# Patient Record
Sex: Female | Born: 1960 | Race: White | Hispanic: No | Marital: Married | Smoking: Never smoker
Health system: Southern US, Community
[De-identification: ages and names within clinical notes are randomized; demographics above are authoritative.]

## PROBLEM LIST (undated history)

## (undated) DIAGNOSIS — M1990 Unspecified osteoarthritis, unspecified site: Secondary | ICD-10-CM

## (undated) HISTORY — DX: Unspecified osteoarthritis, unspecified site: M19.90

---

## 2006-11-17 HISTORY — PX: ABDOMINAL HYSTERECTOMY: SHX81

## 2010-09-16 ENCOUNTER — Ambulatory Visit: Payer: Self-pay | Admitting: Family Medicine

## 2011-09-23 ENCOUNTER — Ambulatory Visit: Payer: Self-pay | Admitting: Family Medicine

## 2012-09-23 ENCOUNTER — Ambulatory Visit: Payer: Self-pay | Admitting: Family Medicine

## 2013-09-26 ENCOUNTER — Ambulatory Visit: Payer: Self-pay | Admitting: Family Medicine

## 2014-09-27 ENCOUNTER — Ambulatory Visit: Payer: Self-pay | Admitting: Family Medicine

## 2015-01-19 ENCOUNTER — Encounter: Payer: Self-pay | Admitting: Podiatry

## 2015-01-19 ENCOUNTER — Ambulatory Visit (INDEPENDENT_AMBULATORY_CARE_PROVIDER_SITE_OTHER): Payer: BLUE CROSS/BLUE SHIELD | Admitting: Podiatry

## 2015-01-19 VITALS — BP 123/73 | HR 68 | Resp 16 | Ht 66.0 in | Wt 255.0 lb

## 2015-01-19 DIAGNOSIS — M779 Enthesopathy, unspecified: Secondary | ICD-10-CM | POA: Diagnosis not present

## 2015-01-19 DIAGNOSIS — M205X9 Other deformities of toe(s) (acquired), unspecified foot: Secondary | ICD-10-CM

## 2015-01-20 NOTE — Progress Notes (Signed)
Subjective:     Patient ID: Jenna Kennedy, female   DOB: August 28, 1961, 54 y.o.   MRN: [id number]  HPI patient has had chronic hallux limitus condition that's been treated with orthotics which is given her reasonable relief with continued discomfort in the joints but nowhere near to the same degree   Review of Systems     Objective:   Physical Exam Neurovascular status intact with continued discomfort in the first MPJ of both feet that has improved quite a bit but still is sore specially without orthotics    Assessment:     Hallux limitus deformity with slight progression left over right with inflammatory capsulitis that is under control currently    Plan:     Scan for new orthotics and reviewed continued rigid bottom shoes and other Hager modification. Reappoint when orthotics returned

## 2015-02-22 ENCOUNTER — Encounter: Payer: Self-pay | Admitting: Podiatry

## 2015-03-22 ENCOUNTER — Ambulatory Visit (INDEPENDENT_AMBULATORY_CARE_PROVIDER_SITE_OTHER): Payer: BLUE CROSS/BLUE SHIELD | Admitting: Podiatry

## 2015-03-22 VITALS — BP 140/86 | HR 65 | Resp 16

## 2015-03-22 DIAGNOSIS — M205X9 Other deformities of toe(s) (acquired), unspecified foot: Secondary | ICD-10-CM | POA: Diagnosis not present

## 2015-03-22 NOTE — Progress Notes (Signed)
Patient ID: Jenna Kennedy, female   DOB: [birth year], 54 y.o.   MRN: [id number]  Subjective:  54 year old female presents the office today with complaints of her orthotics not fitting well and she is unable to wear them for more than 2 hours due to pain. She states  That she continues with her old orthotics as they are more comfortable.  She states she hasn't dry skin and she had a small crack and her skin on the right foot. She denies any drainage or any redness or any clinical signs of infection. No other complaints at this time.  Objective: AAO x3, NAD Neurovascular status unchanged.   there is a decrease and first MTPJ dorsiflexion warm the left than the right. There is pain with first MTPJ range of motion. There is a decrease in medial arch height upon weightbearing. Equinus is present. There is no specific area pinpoint bony tenderness or pain the vibratory sensation to bilateral lower extremes. No overt edema, erythema, increased warmth. On the right foot just distal to the plantar heel there is a small fissure.  MMT 5/5, ROM WNL except for otherwise stated. There is no swelling erythema, drainage or other clinical signs of infection. No other open lesions or pre-ulcer lesions identified bilaterally. No pain with calf compression, swelling, warmth, erythema Upon  evaluation the orthotics it is pubic cut out the first MTPJ is not in the appropriate area and the orthotics have too much of an arch.   Assessment: 54 year old female with bilateral hallux limitus,  presents for pain due to orthotics  Plan: - Treatment options were discussed with the patient going alternatives, risks, competitions. - Upon evaluation the orthotics they do not appear to be fitting well. Because of this we will [employer]. We called the company and they will make a new pair of orthotics based on the original prescription.  Plaster molds were obtained of the feet and sent to [employer].  - Apply antibiotic ointment and a  Band-Aid to the fissure on the right foot. Monitor signs or symptoms of infection and directed call the office if any are to occur. Follow-up nonhealing in 2 weeks. - Follow up once orthotics arrive or sooner if any problems are to arise. In the meantime call the office with any questions, concerns, change in symptoms.

## 2015-03-22 NOTE — Patient Instructions (Signed)
Posterior Tibial Tendon Tendinitis with Rehab Tendonitis is a condition that is characterized by inflammation of a tendon or the lining (sheath) that surrounds it. The inflammation is usually caused by damage to the tendon, such as a tendon tear (strain). Sprains are classified into three categories. Grade 1 sprains cause pain, but the tendon is not lengthened. Grade 2 sprains include a lengthened ligament due to the ligament being stretched or partially ruptured. With grade 2 sprains there is still function, although the function may be diminished. Grade 3 sprains are characterized by a complete tear of the tendon or muscle, and function is usually impaired. Posterior tibialis tendonitis is tendonitis of the posterior tibial tendon, which attaches muscles of the lower leg to the foot. The posterior tibial tendon is located in the back of the ankle and helps the body straighten (plantar flex) and rotate inward (medially rotate) the ankle. SYMPTOMS   Pain, tenderness, swelling, warmth, and/or redness over the back of the inner ankle at the posterior tibial tendon or the inner part of the mid-foot.  Pain that worsens with plantar flexion or medial rotation of the ankle.  A crackling sound (crepitation) when the tendon is moved or touched. CAUSES  Posterior tibial tendonitis occurs when damage to the posterior tibial tendon starts an inflammatory response. Common mechanisms of injury include:  Degenerative (occurs with aging) processes that weaken the tendon and make it more susceptible to injury.  Stress placed on the tendon from an increase in the intensity, frequency, or duration of training.  Direct trauma to the ankle.  Returning to activity before a previous ankle injury is allowed to heal. RISK INCREASES WITH:  Activities that involve repetitive and/or stressful plantar flexion (jumping, kicking, or running up/down hills).  Poor strength and flexibility.  Flat feet.  Previous injury  to the foot, ankle, or leg. PREVENTION   Warm up and stretch properly before activity.  Allow for adequate recovery between workouts.  Maintain physical fitness:  Strength, flexibility, and endurance.  Cardiovascular fitness.  Learn and use proper technique. When possible, have a coach correct improper technique.  Complete rehabilitation from a previous foot, ankle, or leg injury.  If you have flat feet, wear arch supports (orthotics). PROGNOSIS  If treated properly, the symptoms of tendonitis usually resolve within 6 weeks. This period may be shorter for injuries caused by direct trauma. RELATED COMPLICATIONS   Prolonged healing time, if improperly treated or reinjured.  Recurrent symptoms that result in a chronic problem.  Partial or complete tendon tear (rupture) requiring surgery. TREATMENT  Treatment initially involves the use of ice and medication to help reduce pain and inflammation. The use of strengthening and stretching exercises may help reduce pain with activity. These exercises may be performed at home or with referral to a therapist. Often times, your caregiver will recommend immobilizing the ankle to allow the tendon to heal. If you have flat feet, you may be advised to wear orthotic arch supports. If symptoms persist for greater than 6 months despite nonsurgical (conservative) treatment, then surgery may be recommended. MEDICATION   If pain medication is necessary, then nonsteroidal anti-inflammatory medications, such as aspirin and ibuprofen, or other minor pain relievers, such as acetaminophen, are often recommended.  Do not take pain medication for 7 days before surgery.  Prescription pain relievers may be given if deemed necessary by your caregiver. Use only as directed and only as much as you need.  Corticosteroid injections may be given by your caregiver. These injections should  be reserved for the most serious cases because they may only be given a certain  number of times. HEAT AND COLD  Cold treatment (icing) relieves pain and reduces inflammation. Cold treatment should be applied for 10 to 15 minutes every 2 to 3 hours for inflammation and pain and immediately after any activity that aggravates your symptoms. Use ice packs or massage the area with a piece of ice (ice massage).  Heat treatment may be used prior to performing the stretching and strengthening activities prescribed by your caregiver, physical therapist, or athletic trainer. Use a heat pack or soak the injury in warm water. SEEK MEDICAL CARE IF:  Treatment seems to offer no benefit, or the condition worsens.  Any medications produce adverse side effects. EXERCISES RANGE OF MOTION (ROM) AND STRETCHING EXERCISES - Posterior Tibial Tendon Tendinitis These exercises may help you when beginning to rehabilitate your injury. Your symptoms may resolve with or without further involvement from your physician, physical therapist or athletic trainer. While completing these exercises, remember:   Restoring tissue flexibility helps normal motion to return to the joints. This allows healthier, less painful movement and activity.  An effective stretch should be held for at least 30 seconds.  A stretch should never be painful. You should only feel a gentle lengthening or release in the stretched tissue. RANGE OF MOTION - Ankle Plantar Flexion   Sit with your right / left leg crossed over your opposite knee.  Use your opposite hand to pull the top of your foot and toes toward you.  You should feel a gentle stretch on the top of your foot/ankle. Hold this position for __________ seconds. Repeat __________ times. Complete this exercise __________ times per day.  RANGE OF MOTION - Ankle Eversion   Sit with your right / left ankle crossed over your opposite knee.  Grip your foot with your opposite hand, placing your thumb on the top of your foot and your fingers across the bottom of your  foot.  Gently push your foot downward with a slight rotation so your littlest toes rise slightly.  You should feel a gentle stretch on the inside of your ankle. Hold the stretch for __________ seconds. Repeat __________ times. Complete this exercise __________ times per day.  RANGE OF MOTION - Ankle Inversion   Sit with your right / left ankle crossed over your opposite knee.  Grip your foot with your opposite hand, placing your thumb on the bottom of your foot and your fingers across the top of your foot.  Gently pull your foot so the smallest toe comes toward you and your thumb pushes the inside of the ball of your foot away from you.  You should feel a gentle stretch on the outside of your ankle. Hold the stretch for __________ seconds. Repeat __________ times. Complete this exercise __________ times per day.  RANGE OF MOTION - Dorsi/Plantar Flexion  While sitting with your right / left knee straight, draw the top of your foot upward by flexing your ankle. Then reverse the motion, pointing your toes downward.  Hold each position for __________ seconds.  After completing your first set of exercises, repeat this exercise with your knee bent. Repeat __________ times. Complete this exercise __________ times per day.  RANGE OF MOTION - Ankle Alphabet  Imagine your right / left big toe is a pen.  Keeping your hip and knee still, write out the entire alphabet with your "pen." Make the letters as large as you can without   increasing any discomfort. Repeat __________ times. Complete this exercise __________ times per day.  STRETCH - Gastrocsoleus   Sit with your right / left leg extended. Holding onto both ends of a belt or towel, loop it around the ball of your foot.  Keeping your right / left ankle and foot relaxed and your knee straight, pull your foot and ankle toward you using the belt/towel.  You should feel a gentle stretch behind your calf or knee. Hold this position for  __________ seconds. Repeat __________ times. Complete this exercise __________ times per day.  STRETCH - Gastroc, Standing   Place hands on wall.  Extend right / left leg, keeping the front knee somewhat bent.  Slightly point your toes inward on your back foot.  Keeping your right / left heel on the floor and your knee straight, shift your weight toward the wall, not allowing your back to arch.  You should feel a gentle stretch in the right / left calf. Hold this position for __________ seconds. Repeat __________ times. Complete this stretch __________ times per day. STRETCH - Soleus, Standing   Place hands on wall.  Extend right / left leg, keeping the other knee somewhat bent.  Slightly point your toes inward on your back foot.  Keep your right / left heel on the floor, bend your back knee, and slightly shift your weight over the back leg so that you feel a gentle stretch deep in your back calf.  Hold this position for __________ seconds. Repeat __________ times. Complete this stretch __________ times per day. STRENGTHENING EXERCISES - Posterior Tibial Tendon Tendinitis These exercises may help you when beginning to rehabilitate your injury. They may resolve your symptoms with or without further involvement from your physician, physical therapist, or athletic trainer. While completing these exercises, remember:   Muscles can gain both the endurance and the strength needed for everyday activities through controlled exercises.  Complete these exercises as instructed by your physician, physical therapist, or athletic trainer. Progress the resistance and repetitions only as guided. STRENGTH - Dorsiflexors  Secure a rubber exercise band/tubing to a fixed object (i.e., table, pole) and loop the other end around your right / left foot.  Sit on the floor facing the fixed object. The band/tubing should be slightly tense when your foot is relaxed.  Slowly draw your foot back toward you  using your ankle and toes.  Hold this position for __________ seconds. Slowly release the tension in the band and return your foot to the starting position. Repeat __________ times. Complete this exercise __________ times per day.  STRENGTH - Towel Curls  Sit in a chair positioned on a non-carpeted surface.  Place your foot on a towel, keeping your heel on the floor.  Pull the towel toward your heel by only curling your toes. Keep your heel on the floor.  If instructed by your physician, physical therapist, or athletic trainer, add ____________________ at the end of the towel. Repeat __________ times. Complete this exercise __________ times per day. STRENGTH - Ankle Eversion   Secure one end of a rubber exercise band/tubing to a fixed object (table, pole). Loop the other end around your foot just before your toes.  Place your fists between your knees. This will focus your strengthening at your ankle.  Drawing the band/tubing across your opposite foot, slowly pull your little toe out and up. Make sure the band/tubing is positioned to resist the entire motion.  Hold this position for __________ seconds.  Have   your muscles resist the band/tubing as it slowly pulls your foot back to the starting position. Repeat __________ times. Complete this exercise __________ times per day.  STRENGTH - Ankle Inversion   Secure one end of a rubber exercise band/tubing to a fixed object (table, pole). Loop the other end around your foot just before your toes.  Place your fists between your knees. This will focus your strengthening at your ankle.  Slowly, pull your big toe up and in, making sure the band/tubing is positioned to resist the entire motion.  Hold this position for __________ seconds.  Have your muscles resist the band/tubing as it slowly pulls your foot back to the starting position. Repeat __________ times. Complete this exercises __________ times per day.  Document Released:  11/03/2005 Document Revised: 03/20/2014 Document Reviewed: 02/15/2009 [facility] Patient Information 2015 [place], [place]. This information is not intended to replace advice given to you by your health care provider. Make sure you discuss any questions you have with your health care provider.

## 2015-04-23 ENCOUNTER — Telehealth: Payer: Self-pay | Admitting: *Deleted

## 2015-04-23 NOTE — Telephone Encounter (Signed)
Spoke to patient letting her know her orthotics are here and ready for pick up , no appointment needed

## 2015-05-31 ENCOUNTER — Encounter: Payer: Self-pay | Admitting: *Deleted

## 2015-06-19 ENCOUNTER — Ambulatory Visit (INDEPENDENT_AMBULATORY_CARE_PROVIDER_SITE_OTHER): Payer: BLUE CROSS/BLUE SHIELD | Admitting: General Surgery

## 2015-06-19 ENCOUNTER — Encounter: Payer: Self-pay | Admitting: General Surgery

## 2015-06-19 VITALS — BP 120/80 | HR 78 | Resp 12 | Ht 65.0 in | Wt 233.0 lb

## 2015-06-19 DIAGNOSIS — D179 Benign lipomatous neoplasm, unspecified: Secondary | ICD-10-CM | POA: Diagnosis not present

## 2015-06-19 NOTE — Patient Instructions (Signed)
Lipoma  A lipoma is a noncancerous (benign) tumor composed of fat cells. They are usually found under the skin (subcutaneous). A lipoma may occur in any tissue of the body that contains fat. Common areas for lipomas to appear include the back, shoulders, buttocks, and thighs. Lipomas are a very common soft tissue growth. They are soft and grow slowly. Most problems caused by a lipoma depend on where it is growing.  DIAGNOSIS   A lipoma can be diagnosed with a physical exam. These tumors rarely become cancerous, but radiographic studies can help determine this for certain. Studies used may include:  · Computerized X-ray scans (CT or CAT scan).  · Computerized magnetic scans (MRI).  TREATMENT   Small lipomas that are not causing problems may be watched. If a lipoma continues to enlarge or causes problems, removal is often the best treatment. Lipomas can also be removed to improve appearance. Surgery is done to remove the fatty cells and the surrounding capsule. Most often, this is done with medicine that numbs the area (local anesthetic). The removed tissue is examined under a microscope to make sure it is not cancerous. Keep all follow-up appointments with your caregiver.  SEEK MEDICAL CARE IF:   · The lipoma becomes larger or hard.  · The lipoma becomes painful, red, or increasingly swollen. These could be signs of infection or a more serious condition.  Document Released: 10/24/2002 Document Revised: 01/26/2012 Document Reviewed: 04/05/2010  [employer]® Patient Information ©2015 [employer]. This information is not intended to replace advice given to you by your health care provider. Make sure you discuss any questions you have with your health care provider.

## 2015-06-19 NOTE — Progress Notes (Signed)
Patient ID: Jenna Kennedy, female   DOB: 1961/01/19, 54 y.o.   MRN: [id number]  Chief Complaint  Patient presents with  . Cyst    HPI Jenna Kennedy is a 54 y.o. female here today for a evaluation of a right shoulder cyst. Patient states she noticed the area about eight weeks ago. She states the area has got bigger. States that it is bothersome due to being located under bra strap.  HPI  Past Medical History  Diagnosis Date  . Arthritis     Past Surgical History  Procedure Laterality Date  . Abdominal hysterectomy  2008    Family History  Problem Relation Age of Onset  . Hyperlipidemia Mother   . Arthritis Mother     Social History History  Substance Use Topics  . Smoking status: Never Smoker   . Smokeless tobacco: Not on file  . Alcohol Use: 0.0 oz/week    0 Standard drinks or equivalent per week    Allergies  Allergen Reactions  . Penicillins Rash and Nausea And Vomiting  . Sulfa Antibiotics Rash    Current Outpatient Prescriptions  Medication Sig Dispense Refill  . calcium carbonate (OS-CAL) 600 MG TABS tablet Take by mouth.    . Cholecalciferol 2000 UNITS TABS Take by mouth.    Marland Kitchen ibuprofen (ADVIL,MOTRIN) 200 MG tablet Take by mouth.    . Multiple Vitamin (MULTI-VITAMINS) TABS Take by mouth.     No current facility-administered medications for this visit.    Review of Systems Review of Systems  Constitutional: Negative.   Respiratory: Negative.   Cardiovascular: Negative.     Blood pressure 120/80, pulse 78, resp. rate 12, height 5\' 5"  (1.651 m), weight 233 lb (105.688 kg).  Physical Exam Physical Exam  Constitutional: She is oriented to person, place, and time. She appears well-developed and well-nourished.  Eyes: Conjunctivae are normal. No scleral icterus.  Neck: Neck supple.  Pulmonary/Chest:    Lymphadenopathy:    She has no cervical adenopathy.  Neurological: She is alert and oriented to person, place, and time.  Skin: Skin is warm and dry.      Data Reviewed Notes reviewed   Assessment    2 cm lipoma on right shoulder region. Focally symptomatic.      Plan    Excision recommended and discussed. Pt agreeable.     Ref. Dr. Alvy Bimler G 06/19/2015, 4:26 PM

## 2015-07-04 ENCOUNTER — Encounter: Payer: Self-pay | Admitting: General Surgery

## 2015-07-04 ENCOUNTER — Ambulatory Visit (INDEPENDENT_AMBULATORY_CARE_PROVIDER_SITE_OTHER): Payer: BLUE CROSS/BLUE SHIELD | Admitting: General Surgery

## 2015-07-04 VITALS — BP 138/72 | HR 70 | Resp 12 | Ht 65.0 in | Wt 233.0 lb

## 2015-07-04 DIAGNOSIS — D1720 Benign lipomatous neoplasm of skin and subcutaneous tissue of unspecified limb: Secondary | ICD-10-CM | POA: Diagnosis not present

## 2015-07-04 NOTE — Patient Instructions (Addendum)
You may shower. You can remove the waterproof dressing in two days. No swimming for 1 week. Steri strips will fall off in about a week. We will call with pathology results.

## 2015-07-04 NOTE — Progress Notes (Signed)
Patient ID: Jenna Kennedy, female   DOB: 08/24/1961, 54 y.o.   MRN: [id number] Patient here today for scheduled excision of lipoma from right upper shoulder.   Area over right shoulder infiltrated with 67ml 1% xylocaine mixed with 0.5% marcaine. Prep with Chloro Proep. Liner 2cm incision made after area was draped.  2-2.5cm subcutaneous mass consistent with lipoma excised out. Deep tissue closed with 3-0 Vicryl. Skin closed with 4-0  Vicryl. Steristrips, telfa and tegaderm dressing. No immediate problems from procedure. Excised tissue sent to pathology.  PCP: Charlestine Massed

## 2015-07-10 ENCOUNTER — Telehealth: Payer: Self-pay | Admitting: *Deleted

## 2015-07-10 NOTE — Telephone Encounter (Signed)
-----   Message from Christene Lye, MD sent at 07/08/2015  8:08 PM EDT ----- Please let pt pt know the pathology was normal.

## 2015-07-10 NOTE — Telephone Encounter (Signed)
Patient called back for pathology results, patient was pleased.

## 2015-07-30 ENCOUNTER — Telehealth: Payer: Self-pay | Admitting: *Deleted

## 2015-07-30 NOTE — Telephone Encounter (Signed)
Patient had sx 07/04/15 (lipoma removed from RT Shoulder), thinks may have infected stitch where the scar is. Just wanted to see what she needs to do.

## 2015-07-30 NOTE — Telephone Encounter (Signed)
Spoke with patient and she has concerned about possible start of infection in area of shoulder where sutures were removed. She states that she has developed what looks like a pimple or blister and the area is a little tender. Patient states that the soonest she can come in is on 08/01/15. Patient advised to call if area gets worse, otherwise she will be seen on Wednesday.

## 2015-08-01 ENCOUNTER — Ambulatory Visit (INDEPENDENT_AMBULATORY_CARE_PROVIDER_SITE_OTHER): Payer: BLUE CROSS/BLUE SHIELD | Admitting: *Deleted

## 2015-08-01 DIAGNOSIS — D1720 Benign lipomatous neoplasm of skin and subcutaneous tissue of unspecified limb: Secondary | ICD-10-CM

## 2015-08-01 NOTE — Patient Instructions (Signed)
The patient is aware to call back for any questions or concerns.  

## 2015-08-01 NOTE — Progress Notes (Signed)
Patient came in today for a wound check.  The incision has a small knot posteriorly  . Use aloe cream and massage, appointment to see Dr Jamal Collin. Follow up as scheduled.

## 2015-08-14 ENCOUNTER — Encounter: Payer: Self-pay | Admitting: General Surgery

## 2015-08-14 ENCOUNTER — Ambulatory Visit (INDEPENDENT_AMBULATORY_CARE_PROVIDER_SITE_OTHER): Payer: BLUE CROSS/BLUE SHIELD | Admitting: General Surgery

## 2015-08-14 VITALS — BP 130/70 | HR 74 | Resp 12 | Ht 65.0 in | Wt 234.0 lb

## 2015-08-14 DIAGNOSIS — D1720 Benign lipomatous neoplasm of skin and subcutaneous tissue of unspecified limb: Secondary | ICD-10-CM

## 2015-08-14 NOTE — Patient Instructions (Signed)
Return as needed. Return if the site becomes red or more painful.

## 2015-08-14 NOTE — Progress Notes (Signed)
This is a 54 year old female here today for an infected scar line on right shoulder. She is 6 weeks post-op. The scar was very sore to the touch the past 2 weeks but has improved some the past 4 days and she can now touch it. She feels it might be a stitch. This is likely a stitch, which will resolve. Pt advised to return as needed.

## 2015-09-06 ENCOUNTER — Other Ambulatory Visit: Payer: Self-pay | Admitting: Family Medicine

## 2015-09-06 DIAGNOSIS — Z1231 Encounter for screening mammogram for malignant neoplasm of breast: Secondary | ICD-10-CM

## 2015-10-01 ENCOUNTER — Ambulatory Visit
Admission: RE | Admit: 2015-10-01 | Discharge: 2015-10-01 | Disposition: A | Discharge status: Home / Self Care | Payer: BLUE CROSS/BLUE SHIELD | Source: Ambulatory Visit | Attending: Family Medicine | Admitting: Family Medicine

## 2015-10-01 DIAGNOSIS — Z1231 Encounter for screening mammogram for malignant neoplasm of breast: Secondary | ICD-10-CM | POA: Insufficient documentation

## 2016-06-17 ENCOUNTER — Ambulatory Visit (INDEPENDENT_AMBULATORY_CARE_PROVIDER_SITE_OTHER): Payer: BLUE CROSS/BLUE SHIELD | Admitting: Podiatry

## 2016-06-17 ENCOUNTER — Ambulatory Visit (INDEPENDENT_AMBULATORY_CARE_PROVIDER_SITE_OTHER): Payer: BLUE CROSS/BLUE SHIELD

## 2016-06-17 ENCOUNTER — Encounter: Payer: Self-pay | Admitting: Podiatry

## 2016-06-17 DIAGNOSIS — M25571 Pain in right ankle and joints of right foot: Secondary | ICD-10-CM

## 2016-06-17 DIAGNOSIS — M722 Plantar fascial fibromatosis: Secondary | ICD-10-CM | POA: Diagnosis not present

## 2016-06-17 DIAGNOSIS — R52 Pain, unspecified: Secondary | ICD-10-CM | POA: Diagnosis not present

## 2016-06-17 DIAGNOSIS — M19071 Primary osteoarthritis, right ankle and foot: Secondary | ICD-10-CM | POA: Diagnosis not present

## 2016-06-17 MED ORDER — MELOXICAM 15 MG PO TABS: 15.0000 mg | ORAL_TABLET | Freq: Every day | ORAL | 2 refills | Status: DC

## 2016-06-17 NOTE — Patient Instructions (Signed)

## 2016-06-17 NOTE — Progress Notes (Signed)
Subjective: 55 year old female presents the office concerns of left heel pain right ankle pain. The heel pain is in ongoing for about 3 weeks and the ankle pain started after she was of the ankle pain is in compensation due to the left foot pain. She states that she's had plantar fasciitis and left she states that it feels the same as it did. She was doing a lot of walking about 3 weeks ago she noticed a pulling on the bottom of her heel. Since that she's had discomfort. The pain has gotten better but last couple weeks. Denies any recent injury or trauma. No swelling or redness. No tingling or numbness. The pain doesn't wake her up at night. Denies any systemic complaints such as fevers, chills, nausea, vomiting. No acute changes since last appointment, and no other complaints at this time.   Objective: AAO x3, NAD DP/PT pulses palpable bilaterally, CRT less than 3 seconds Tenderness to palpation along the plantar medial tubercle of the calcaneus at the insertion of plantar fascia on the left foot. There is no pain along the course of the plantar fascia within the arch of the foot. Plantar fascia appears to be intact. There is no pain with lateral compression of the calcaneus or pain with vibratory sensation. There is no pain along the course or insertion of the achilles tendon. Mild discomfort along the lateral ankle along the distal fibula. No overlying edema, erythema, increase in warmth. No other areas of tenderness to bilateral lower extremities. No edema, erythema, increase in warmth to bilateral lower extremities.  No open lesions or pre-ulcerative lesions.  No pain with calf compression, swelling, warmth, erythema  Assessment: Left heel pain, likely plantar fasciitis of the right ankle pain/osteoarthritis/exostosis  Plan: -All treatment options discussed with the patient including all alternatives, risks, complications.  -X-rays obtained and reviewed the patient. On the left side there is a  inferior calcaneal spur present, hammertoes, HAV, old injury to the distal fibula. 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 No evidence of acute fracture bilaterally. -She will follow-up on any steroid injection since her pain has gotten better. It did prescribe meloxicam. Discussed side effects the medication. -She has a plantar fascial brace at home artery and she has not been wearing them. Recommended start wearing this. -Stretching, icing exercises daily -Supportive shoe gear -Discussed the findings and the right ankle. Likely result of old injury, early arthritis. Ankle brace if needed. -Office concerns or sooner if needed. -Patient encouraged to call the office with any questions, concerns, change in symptoms.   Celesta Gentile, DPM

## 2016-07-03 ENCOUNTER — Ambulatory Visit: Payer: BLUE CROSS/BLUE SHIELD | Admitting: Podiatry

## 2016-08-13 ENCOUNTER — Other Ambulatory Visit: Payer: Self-pay | Admitting: Family Medicine

## 2016-08-13 DIAGNOSIS — Z1231 Encounter for screening mammogram for malignant neoplasm of breast: Secondary | ICD-10-CM

## 2016-10-02 ENCOUNTER — Ambulatory Visit
Admission: RE | Admit: 2016-10-02 | Discharge: 2016-10-02 | Disposition: A | Discharge status: Home / Self Care | Payer: BLUE CROSS/BLUE SHIELD | Source: Ambulatory Visit | Attending: Family Medicine | Admitting: Family Medicine

## 2016-10-02 DIAGNOSIS — Z1231 Encounter for screening mammogram for malignant neoplasm of breast: Secondary | ICD-10-CM | POA: Insufficient documentation

## 2017-01-23 DIAGNOSIS — M1120 Other chondrocalcinosis, unspecified site: Secondary | ICD-10-CM | POA: Insufficient documentation

## 2017-02-18 DIAGNOSIS — M2391 Unspecified internal derangement of right knee: Secondary | ICD-10-CM | POA: Insufficient documentation

## 2017-03-12 DIAGNOSIS — Z7189 Other specified counseling: Secondary | ICD-10-CM | POA: Insufficient documentation

## 2017-05-05 DIAGNOSIS — M2021 Hallux rigidus, right foot: Secondary | ICD-10-CM | POA: Insufficient documentation

## 2017-05-05 DIAGNOSIS — M205X2 Other deformities of toe(s) (acquired), left foot: Secondary | ICD-10-CM | POA: Insufficient documentation

## 2017-09-01 ENCOUNTER — Other Ambulatory Visit: Payer: Self-pay | Admitting: Family Medicine

## 2017-09-01 DIAGNOSIS — Z1231 Encounter for screening mammogram for malignant neoplasm of breast: Secondary | ICD-10-CM

## 2017-10-05 ENCOUNTER — Ambulatory Visit
Admission: RE | Admit: 2017-10-05 | Discharge: 2017-10-05 | Disposition: A | Discharge status: Home / Self Care | Payer: BLUE CROSS/BLUE SHIELD | Source: Ambulatory Visit | Attending: Family Medicine | Admitting: Family Medicine

## 2017-10-05 DIAGNOSIS — Z1231 Encounter for screening mammogram for malignant neoplasm of breast: Secondary | ICD-10-CM | POA: Insufficient documentation

## 2017-10-20 DIAGNOSIS — R7303 Prediabetes: Secondary | ICD-10-CM | POA: Insufficient documentation

## 2018-04-27 DIAGNOSIS — E782 Mixed hyperlipidemia: Secondary | ICD-10-CM | POA: Insufficient documentation

## 2018-08-09 ENCOUNTER — Ambulatory Visit: Payer: BLUE CROSS/BLUE SHIELD | Admitting: Podiatry

## 2018-08-09 ENCOUNTER — Ambulatory Visit (INDEPENDENT_AMBULATORY_CARE_PROVIDER_SITE_OTHER): Payer: BLUE CROSS/BLUE SHIELD

## 2018-08-09 ENCOUNTER — Encounter: Payer: Self-pay | Admitting: Podiatry

## 2018-08-09 DIAGNOSIS — M2042 Other hammer toe(s) (acquired), left foot: Secondary | ICD-10-CM

## 2018-08-09 DIAGNOSIS — M2012 Hallux valgus (acquired), left foot: Secondary | ICD-10-CM

## 2018-08-09 DIAGNOSIS — M722 Plantar fascial fibromatosis: Secondary | ICD-10-CM

## 2018-08-09 MED ORDER — MELOXICAM 15 MG PO TABS: 15.0000 mg | ORAL_TABLET | Freq: Every day | ORAL | 0 refills | Status: DC

## 2018-08-09 NOTE — Progress Notes (Signed)
This patient  Presents to the office with chief complaint of a painful left heel.  She states that she is previously been treated for plantar fasciitis by Dr. Jacqualyn Posey in 2017.  She was treated with Mobic by mouth.  She was also given orthoses to be worn in her footgear.  She says she has been very active and she started to have pain and discomfort in her left heel.  She says she still exercises so foot upon rising in the morning.  She presents to the office today bringing 3 pairs of orthoses which she wears in her shoes.  She says she is scheduled to go to [place] soon and desires to have her foot evaluated and treated prior to her trip.  She admits she is not having any pain or discomfort to her ankle left.     Vascular  Dorsalis pedis and posterior tibial pulses are palpable  B/L.  Capillary return  WNL.  Temperature gradient is  WNL.  Skin turgor  WNL  Sensorium  Senn Weinstein monofilament wire  WNL. Normal tactile sensation.  Nail Exam  Patient has normal nails with no evidence of bacterial or fungal infection.  Orthopedic  Exam  Muscle tone and muscle strength  WNL.  No limitations of motion feet  B/L.  No crepitus or joint effusion noted.  Foot type is unremarkable and digits show no abnormalities. Palpable pain noted at the insertion of the plantar fascia left foot.  HAV left foot.  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  Deviated toes 2,3,4 left with hammer toe second toe left foot.  Skin  No open lesions.  Normal skin texture and turgor.  Plantar fasciitis left heel.  HAV left  Hammer toes left.  Tailors bunion left.x-rays taken reveal calcification at the insertion of the plantar fascia left heel  ROV.  X-rays taken reveal calcification at the insertion of the plantar fascia left heel.  Discussed this condition with patient.  Patient does have pain in the bottom of her foot  which id 7 out of 10 at times.  Discussed this condition with this patient.  Told her to continue wearing her insoles in  her shoes.  Told her to continue taking her Naprosyn by mouth.  Treated her with injection therapy left heel.  Injection therapy using 1.0 cc. Of 2% xylocaine( 20 mg.) plus 1 cc. of kenalog-la ( 10 mg) plus 1/2 cc. of dexamethazone phosphate ( 2 mg). RTC 3 weeks.      Gardiner Barefoot DPM

## 2018-09-07 ENCOUNTER — Other Ambulatory Visit: Payer: Self-pay | Admitting: Family Medicine

## 2018-09-07 DIAGNOSIS — Z1231 Encounter for screening mammogram for malignant neoplasm of breast: Secondary | ICD-10-CM

## 2018-09-09 ENCOUNTER — Ambulatory Visit: Payer: BLUE CROSS/BLUE SHIELD | Admitting: Podiatry

## 2018-10-11 ENCOUNTER — Ambulatory Visit
Admission: RE | Admit: 2018-10-11 | Discharge: 2018-10-11 | Disposition: A | Discharge status: Home / Self Care | Payer: BLUE CROSS/BLUE SHIELD | Source: Ambulatory Visit | Attending: Family Medicine | Admitting: Family Medicine

## 2018-10-11 DIAGNOSIS — Z1231 Encounter for screening mammogram for malignant neoplasm of breast: Secondary | ICD-10-CM | POA: Insufficient documentation

## 2019-08-11 ENCOUNTER — Ambulatory Visit (INDEPENDENT_AMBULATORY_CARE_PROVIDER_SITE_OTHER): Payer: BC Managed Care – PPO | Admitting: Podiatry

## 2019-08-11 ENCOUNTER — Other Ambulatory Visit: Payer: Self-pay

## 2019-08-11 ENCOUNTER — Encounter: Payer: Self-pay | Admitting: Podiatry

## 2019-08-11 DIAGNOSIS — M2012 Hallux valgus (acquired), left foot: Secondary | ICD-10-CM | POA: Diagnosis not present

## 2019-08-11 DIAGNOSIS — M722 Plantar fascial fibromatosis: Secondary | ICD-10-CM | POA: Diagnosis not present

## 2019-08-11 NOTE — Progress Notes (Signed)
This patient presents the office requesting new inserts for her shoes.  She says she has 3 pairs which she brings into the office and shows me her latest pair.  This pair was made in [place] and she says they are crap.  She presents the office today to acquire a new set of orthoses for her shoes.  Vascular  Dorsalis pedis and posterior tibial pulses are palpable  B/L.  Capillary return  WNL.  Temperature gradient is  WNL.  Skin turgor  WNL  Sensorium  Senn Weinstein monofilament wire  WNL. Normal tactile sensation.  Nail Exam  Patient has normal nails with no evidence of bacterial or fungal infection. Pincer hallux nails both feet.  Orthopedic  Exam  Muscle tone and muscle strength  WNL.  No limitations of motion feet  B/L.  No crepitus or joint effusion noted.  Foot type is unremarkable and digits show no abnormalities.  HAV and tailor's bunion fifth left foot.  Deviated toes left foot.  No evidence of any palpable pain at the insertion of the plantar fascia left foot.  Skin  No open lesions.  Normal skin texture and turgor.  Plantar Fasciitis  Left foot.  ROV.  Made an appointment for this patient with Liliane Channel for her to be measured and to receive a new pair of orthoses.  She was also told to bring her old orthoses to that visit.  Return to clinic to see Liliane Channel.  Gardiner Barefoot DPM

## 2019-08-17 ENCOUNTER — Other Ambulatory Visit: Payer: BC Managed Care – PPO | Admitting: Orthotics

## 2019-08-22 ENCOUNTER — Other Ambulatory Visit: Payer: Self-pay | Admitting: Family Medicine

## 2019-08-22 DIAGNOSIS — Z1231 Encounter for screening mammogram for malignant neoplasm of breast: Secondary | ICD-10-CM

## 2019-09-21 ENCOUNTER — Other Ambulatory Visit: Payer: BC Managed Care – PPO | Admitting: Orthotics

## 2019-10-05 ENCOUNTER — Other Ambulatory Visit: Payer: Self-pay

## 2019-10-05 ENCOUNTER — Ambulatory Visit: Payer: BC Managed Care – PPO | Admitting: Orthotics

## 2019-10-05 DIAGNOSIS — M722 Plantar fascial fibromatosis: Secondary | ICD-10-CM

## 2019-10-05 DIAGNOSIS — M2042 Other hammer toe(s) (acquired), left foot: Secondary | ICD-10-CM

## 2019-10-05 DIAGNOSIS — M2012 Hallux valgus (acquired), left foot: Secondary | ICD-10-CM

## 2019-10-05 NOTE — Progress Notes (Signed)
Patient came into today for casting bilateral f/o to address plantar fasciitis.  Patient reports history of foot pain involving plantar aponeurosis.  Goal is to provide longitudinal arch support and correct any RF instability due to heel eversion/inversion.  Ultimate goal is to relieve tension at pf insertion calcaneal tuberosity.  Plan on semi-rigid device addressing heel stability and relieving PF tension.     Will repeat [employer] device from 2017

## 2019-10-20 ENCOUNTER — Ambulatory Visit
Admission: RE | Admit: 2019-10-20 | Discharge: 2019-10-20 | Disposition: A | Discharge status: Home / Self Care | Payer: BC Managed Care – PPO | Source: Ambulatory Visit | Attending: Family Medicine | Admitting: Family Medicine

## 2019-10-20 DIAGNOSIS — Z1231 Encounter for screening mammogram for malignant neoplasm of breast: Secondary | ICD-10-CM

## 2019-10-27 ENCOUNTER — Telehealth: Payer: Self-pay | Admitting: Podiatry

## 2019-10-27 NOTE — Telephone Encounter (Signed)
Pt left 2 message for me to call to discuss appt that was scheduled for [place] tomorrow and she was called this am to cxl because orthotics are not in..   I return call and explained that the orthotics are in production with [employer] along with the refurbished pair and [employer] told me they are in production.  She asked about the charges.and I told her the charges should have been put in at her appt but I have now put them in so they are in for 2020. I told we would call when the orthotics come in (should be next week). Pt may stop by 12.16 when she is in the area to see if they are there.

## 2019-10-28 ENCOUNTER — Other Ambulatory Visit: Payer: BC Managed Care – PPO | Admitting: Orthotics

## 2019-11-02 ENCOUNTER — Other Ambulatory Visit: Payer: Self-pay

## 2019-11-02 ENCOUNTER — Ambulatory Visit (INDEPENDENT_AMBULATORY_CARE_PROVIDER_SITE_OTHER): Payer: BC Managed Care – PPO | Admitting: Orthotics

## 2019-11-02 DIAGNOSIS — M722 Plantar fascial fibromatosis: Secondary | ICD-10-CM

## 2019-11-02 DIAGNOSIS — M2042 Other hammer toe(s) (acquired), left foot: Secondary | ICD-10-CM

## 2019-11-02 NOTE — Progress Notes (Signed)
Patient came in today to pick up custom made foot orthotics.  The goals were accomplished and the patient reported no dissatisfaction with said orthotics.  Patient was advised of breakin period and how to report any issues.Patient came in today to pick up custom made foot orthotics.  The goals were accomplished and the patient reported no dissatisfaction with said orthotics.  Patient was advised of breakin period and how to report any issues. 

## 2019-12-19 DIAGNOSIS — B029 Zoster without complications: Secondary | ICD-10-CM | POA: Insufficient documentation

## 2019-12-19 DIAGNOSIS — R079 Chest pain, unspecified: Secondary | ICD-10-CM | POA: Insufficient documentation

## 2020-09-18 ENCOUNTER — Other Ambulatory Visit: Payer: Self-pay | Admitting: Family Medicine

## 2020-09-18 DIAGNOSIS — Z1231 Encounter for screening mammogram for malignant neoplasm of breast: Secondary | ICD-10-CM

## 2020-11-28 ENCOUNTER — Ambulatory Visit
Admission: RE | Admit: 2020-11-28 | Discharge: 2020-11-28 | Disposition: A | Discharge status: Home / Self Care | Payer: BC Managed Care – PPO | Source: Ambulatory Visit | Attending: Family Medicine | Admitting: Family Medicine

## 2020-11-28 ENCOUNTER — Other Ambulatory Visit: Payer: Self-pay

## 2020-11-28 DIAGNOSIS — Z1231 Encounter for screening mammogram for malignant neoplasm of breast: Secondary | ICD-10-CM | POA: Diagnosis not present

## 2021-02-15 DIAGNOSIS — E559 Vitamin D deficiency, unspecified: Secondary | ICD-10-CM | POA: Insufficient documentation

## 2021-02-15 DIAGNOSIS — M549 Dorsalgia, unspecified: Secondary | ICD-10-CM | POA: Insufficient documentation

## 2021-04-09 DIAGNOSIS — G8929 Other chronic pain: Secondary | ICD-10-CM | POA: Insufficient documentation

## 2021-04-09 DIAGNOSIS — M545 Low back pain, unspecified: Secondary | ICD-10-CM | POA: Insufficient documentation

## 2021-05-02 DIAGNOSIS — Z90710 Acquired absence of both cervix and uterus: Secondary | ICD-10-CM | POA: Insufficient documentation

## 2021-11-04 ENCOUNTER — Other Ambulatory Visit: Payer: Self-pay | Admitting: Family Medicine

## 2021-11-04 DIAGNOSIS — Z1231 Encounter for screening mammogram for malignant neoplasm of breast: Secondary | ICD-10-CM

## 2021-12-18 ENCOUNTER — Other Ambulatory Visit: Payer: Self-pay

## 2021-12-18 ENCOUNTER — Ambulatory Visit
Admission: RE | Admit: 2021-12-18 | Discharge: 2021-12-18 | Disposition: A | Discharge status: Home / Self Care | Payer: BC Managed Care – PPO | Source: Ambulatory Visit | Attending: Family Medicine | Admitting: Family Medicine

## 2021-12-18 DIAGNOSIS — Z1231 Encounter for screening mammogram for malignant neoplasm of breast: Secondary | ICD-10-CM | POA: Insufficient documentation

## 2022-01-22 ENCOUNTER — Encounter: Payer: Self-pay | Admitting: Podiatry

## 2022-01-22 ENCOUNTER — Ambulatory Visit (INDEPENDENT_AMBULATORY_CARE_PROVIDER_SITE_OTHER): Payer: BC Managed Care – PPO | Admitting: Podiatry

## 2022-01-22 ENCOUNTER — Encounter: Payer: Self-pay | Admitting: *Deleted

## 2022-01-22 ENCOUNTER — Other Ambulatory Visit: Payer: Self-pay

## 2022-01-22 DIAGNOSIS — L600 Ingrowing nail: Secondary | ICD-10-CM

## 2022-01-22 MED ORDER — NEOMYCIN-POLYMYXIN-HC 1 % OT SOLN: OTIC | 1 refills | Status: AC

## 2022-01-22 NOTE — Progress Notes (Signed)
She presents today chief complaint of painful ingrown toenails to the hallux bilateral and the second digit of the right foot.  States that these been bothering her for quite some time the shape of the hallux left is bothersome for her as well. ? ?Objective: Vital signs are stable alert oriented x3 she has mild hallux valgus deformities bilateral hammertoe deformities second bilateral she also has ingrown nail tibial border hallux left with thickening of the dorsal portion of the nail.  Tibial border of the second digit of the right foot and both borders of the hallux of the right foot demonstrate mild erythema no purulence no malodor on any of these.  Just tenderness and chronic soreness. ? ?Assessment: Ingrown toenails first bilateral second right. ? ?Plan: Chemical matrixectomy's were performed to the aforementioned borders today after local anesthetic was administered to the each toe.  She tolerated procedure well with out complications.  And she will be given a prescription for Cortisporin otic to be applied twice daily after soaking.  I will follow-up with her in 2 weeks to make sure she is doing well questions or concerns she will notify us immediately. ?

## 2022-01-22 NOTE — Patient Instructions (Signed)

## 2022-02-12 ENCOUNTER — Ambulatory Visit (INDEPENDENT_AMBULATORY_CARE_PROVIDER_SITE_OTHER): Payer: BC Managed Care – PPO | Admitting: Podiatry

## 2022-02-12 ENCOUNTER — Encounter: Payer: Self-pay | Admitting: Podiatry

## 2022-02-12 ENCOUNTER — Other Ambulatory Visit: Payer: Self-pay

## 2022-02-12 ENCOUNTER — Telehealth: Payer: Self-pay | Admitting: *Deleted

## 2022-02-12 DIAGNOSIS — L03032 Cellulitis of left toe: Secondary | ICD-10-CM

## 2022-02-12 MED ORDER — DOXYCYCLINE HYCLATE 100 MG PO TABS: 100.0000 mg | ORAL_TABLET | Freq: Two times a day (BID) | ORAL | 0 refills | Status: DC

## 2022-02-12 MED ORDER — DOXYCYCLINE HYCLATE 100 MG PO TABS: 100.0000 mg | ORAL_TABLET | Freq: Two times a day (BID) | ORAL | 0 refills | Status: AC

## 2022-02-12 NOTE — Telephone Encounter (Signed)
"  I was in there this morning to see Dr. Milinda Pointer.  He was going to call in a prescription for me for an antibiotic.  I just got notification from [facility] that the prescription is ready.  That is not my pharmacy any longer.  We switched that to the [facility] on [address] in [place].  [facility] not [facility].  Can you call that prescription to [facility] in [place]?" ?

## 2022-02-12 NOTE — Addendum Note (Signed)
Addended by: Rip Harbour on: 02/12/2022 05:03 PM ? ? Modules accepted: Orders ? ?

## 2022-02-12 NOTE — Telephone Encounter (Signed)
Sent in Rx to correct pharmacy, notified patient, but patient had already [facility] ?

## 2022-02-12 NOTE — Progress Notes (Signed)
She presents today for follow-up of her nail procedure hallux bilateral and second toe right states that the left one might be getting a little bit infected she can states that she has been continuing to soak regularly and applying Cortisporin otic as directed. ? ?Objective: Vital signs are stable she is alert and oriented x3.  Hallux left demonstrates what appears to be some mild erythema no purulence no malodor. ? ?Assessment mild paronychia status post matrixectomy. ? ?Plan: Encouraged her to continue to soak Epsom salts and warm water and send over prescription for doxycycline.  She will continue Cortisporin otic drops and I will follow-up with her in a couple of weeks. ?

## 2022-03-05 ENCOUNTER — Ambulatory Visit (INDEPENDENT_AMBULATORY_CARE_PROVIDER_SITE_OTHER): Payer: BC Managed Care – PPO | Admitting: Podiatry

## 2022-03-05 ENCOUNTER — Encounter: Payer: Self-pay | Admitting: Podiatry

## 2022-03-05 DIAGNOSIS — L03032 Cellulitis of left toe: Secondary | ICD-10-CM

## 2022-03-05 NOTE — Progress Notes (Signed)
She presents today for follow-up of a nail procedure the hallux bilaterally and the second toe right.  States that the 1 on the left seems to not be healing as fast and looks like it may be infected. ? ?Objective: Vital signs are stable alert oriented x3 there is no erythema just some mild edema no cellulitis drainage or odor the does not appear that there is any infection in the toenail or under the toenail. ? ?Assessment: Slowly resolving paronychia status post matrixectomy's. ? ?Plan: Follow-up with me on an as-needed basis. ?

## 2022-05-12 ENCOUNTER — Telehealth: Payer: Self-pay | Admitting: Podiatry

## 2022-07-03 IMAGING — MG MM DIGITAL SCREENING BILAT W/ TOMO AND CAD
7 series · 8 of 19 positions shown · non-contrast
Comparison: Previous exam(s).

CLINICAL DATA: Screening.

EXAM:
DIGITAL SCREENING BILATERAL MAMMOGRAM WITH TOMOSYNTHESIS AND CAD
TECHNIQUE: Bilateral screening digital craniocaudal and mediolateral oblique
mammograms were obtained. Bilateral screening digital breast
tomosynthesis was performed. The images were evaluated with
computer-aided detection.

[R CC synth-2D]
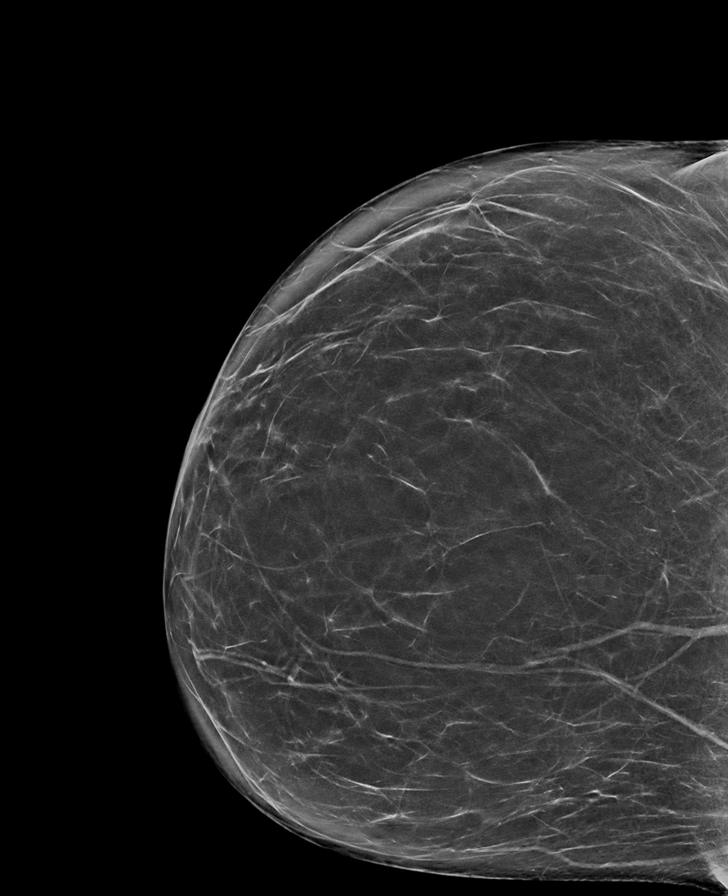

[R MLO synth-2D]
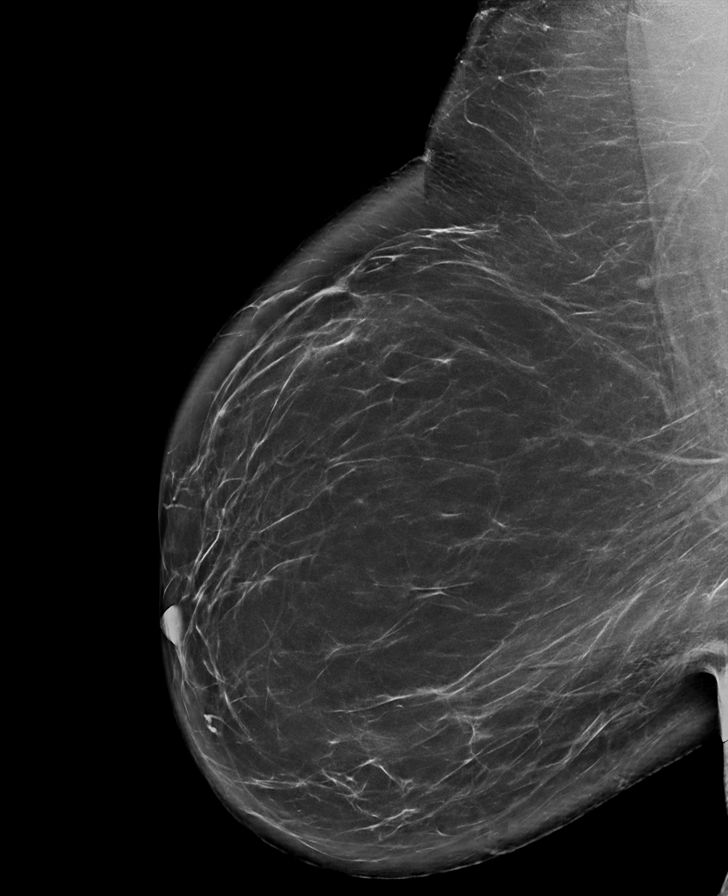

[L CC synth-2D]
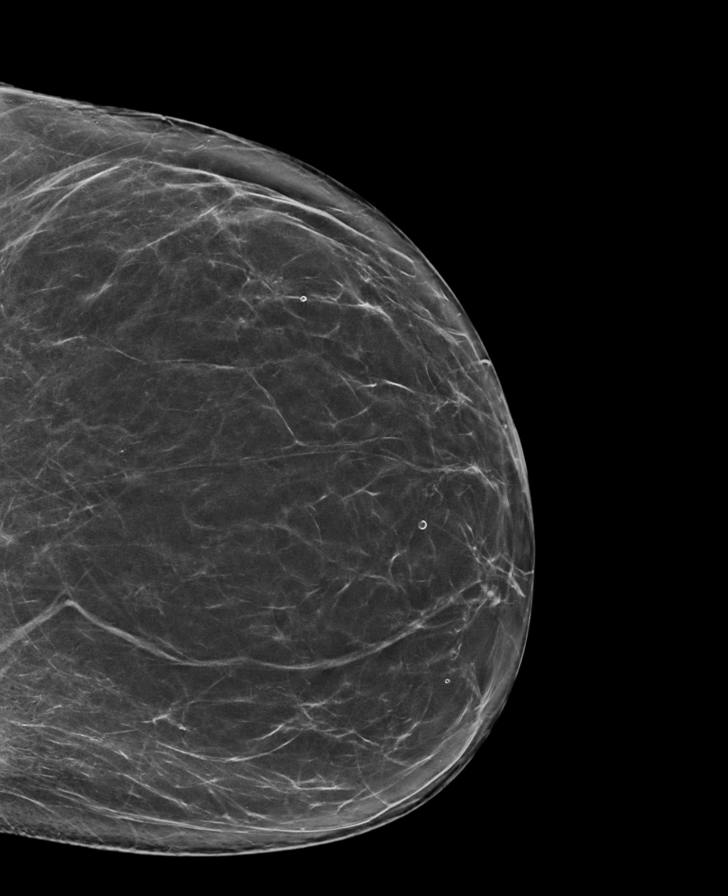

[L MLO synth-2D]
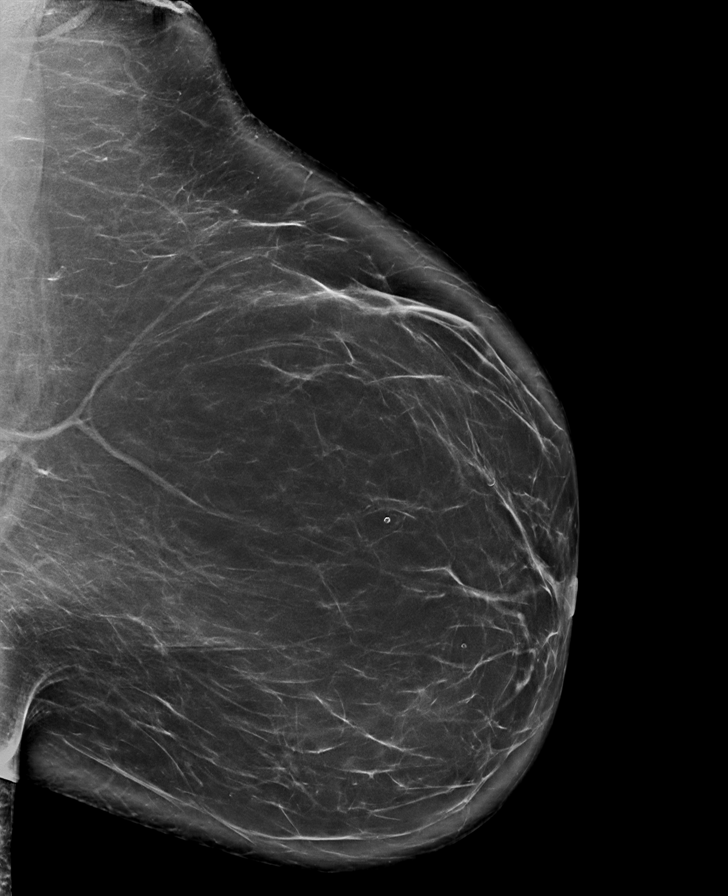

[L MLO tomo · 2 of 99 frames shown]
[frame 32/99]
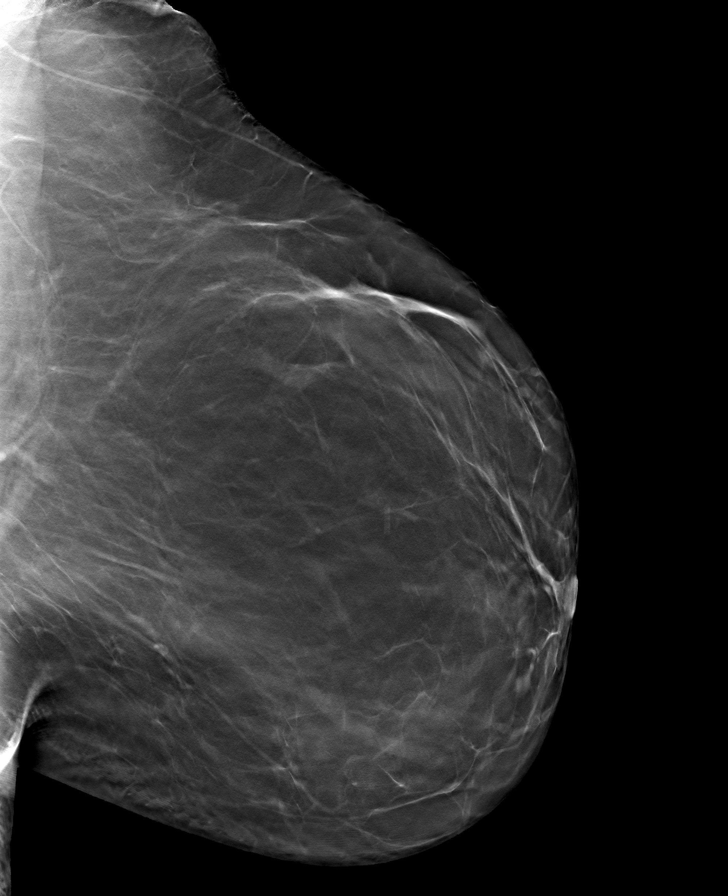
[frame 50/99]
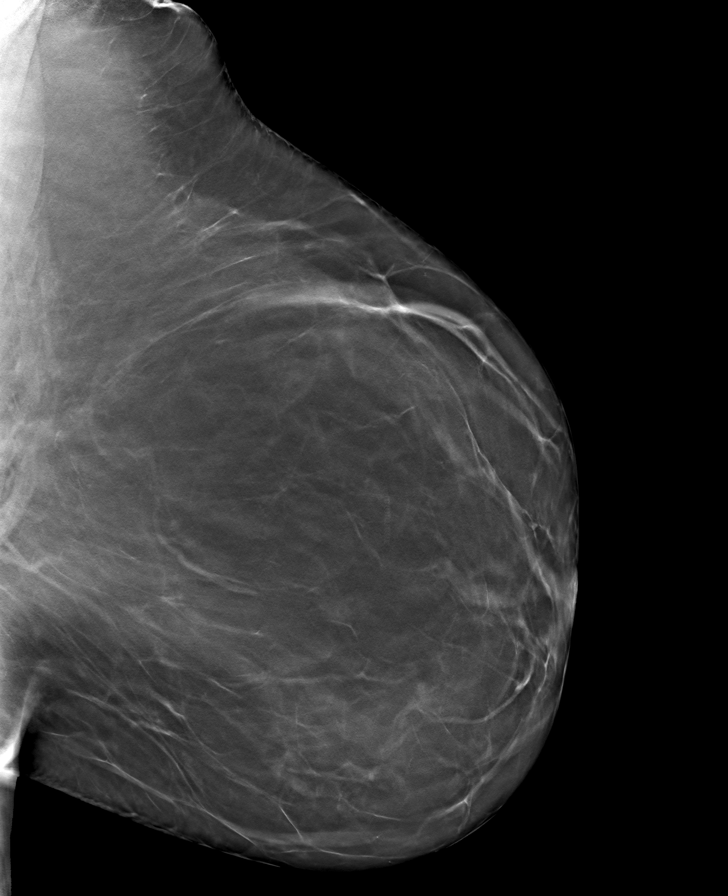

[R CC tomo · tomo slice 41/82.0]
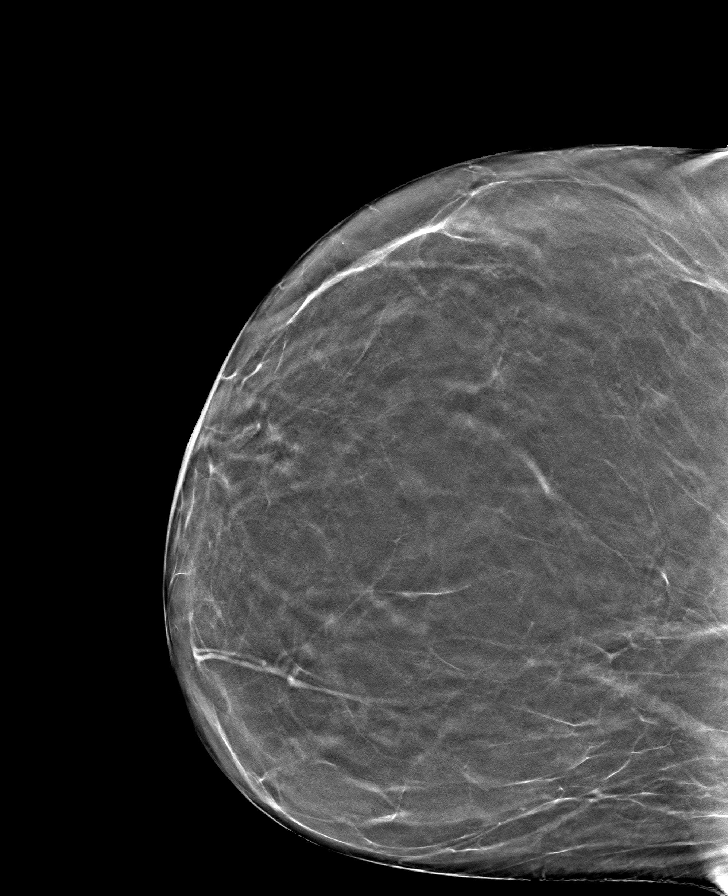

[L CC tomo · tomo slice 43/84.0]
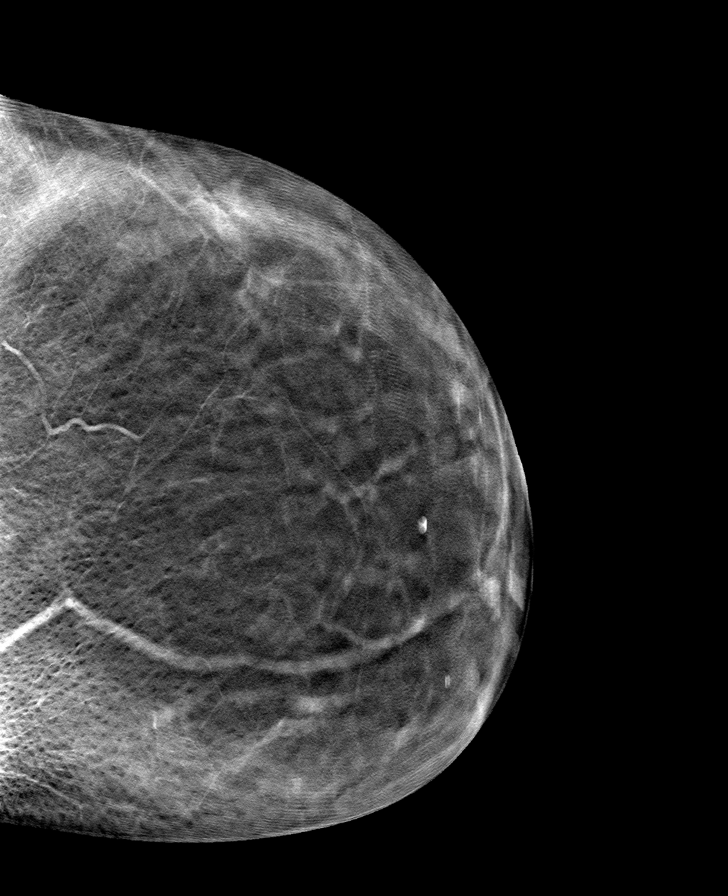

[8 of 19 positions shown; findings below may reference images not displayed]

ACR Breast Density Category b: There are scattered areas of
fibroglandular density.
FINDINGS: There are no findings suspicious for malignancy.
IMPRESSION: No mammographic evidence of malignancy. A result letter of this
screening mammogram will be mailed directly to the patient.

RECOMMENDATION:
Screening mammogram in one year. (Code:51-O-LD2)

BI-RADS CATEGORY  1: Negative.

## 2022-12-18 ENCOUNTER — Other Ambulatory Visit: Payer: Self-pay | Admitting: Family Medicine

## 2022-12-18 DIAGNOSIS — Z1231 Encounter for screening mammogram for malignant neoplasm of breast: Secondary | ICD-10-CM

## 2023-02-25 ENCOUNTER — Ambulatory Visit
Admission: RE | Admit: 2023-02-25 | Discharge: 2023-02-25 | Disposition: A | Discharge status: Home / Self Care | Payer: BC Managed Care – PPO | Source: Ambulatory Visit | Attending: Family Medicine | Admitting: Family Medicine

## 2023-02-25 DIAGNOSIS — Z1231 Encounter for screening mammogram for malignant neoplasm of breast: Secondary | ICD-10-CM | POA: Diagnosis present

## 2024-02-22 ENCOUNTER — Other Ambulatory Visit: Payer: Self-pay | Admitting: Family Medicine

## 2024-02-22 DIAGNOSIS — Z1231 Encounter for screening mammogram for malignant neoplasm of breast: Secondary | ICD-10-CM

## 2024-03-16 ENCOUNTER — Ambulatory Visit
Admission: RE | Admit: 2024-03-16 | Discharge: 2024-03-16 | Disposition: A | Discharge status: Home / Self Care | Source: Ambulatory Visit | Attending: Family Medicine | Admitting: Family Medicine

## 2024-03-16 DIAGNOSIS — Z1231 Encounter for screening mammogram for malignant neoplasm of breast: Secondary | ICD-10-CM | POA: Insufficient documentation

## 2024-03-23 ENCOUNTER — Telehealth: Payer: Self-pay | Admitting: Podiatry

## 2024-03-23 NOTE — Telephone Encounter (Signed)
 Patient called wants orthotics. I scheduled her appt with Trish.

## 2024-05-13 ENCOUNTER — Ambulatory Visit

## 2024-05-13 NOTE — Progress Notes (Signed)
 Patient was present has old pair of [employer] she would like duplicated if possible  I will call [employer] and ask if they still have her on file

## 2024-05-16 NOTE — Progress Notes (Signed)
 Spoke with [employer] today they still have scans on file for patient and will fabricate new inserts same as last  Called patient and spoke with her and will call her when in to pick up  Lolita Schultze Cped, CFo, Cfm

## 2024-06-14 ENCOUNTER — Telehealth: Payer: Self-pay | Admitting: Podiatry

## 2024-06-14 NOTE — Telephone Encounter (Signed)
 Patient following up to check on the status of orthotic. Could you please call with an update when available? Thank you.

## 2024-06-16 NOTE — Telephone Encounter (Signed)
 Called Pt and LVM and sent MyChart
